# Patient Record
Sex: Female | Born: 1985
Health system: Southern US, Community
[De-identification: ages and names within clinical notes are randomized; demographics above are authoritative.]

---

## 2019-05-06 ENCOUNTER — Telehealth: Payer: Self-pay

## 2019-05-06 NOTE — Telephone Encounter (Signed)
Ok to schedule with me. Thanks

## 2019-05-06 NOTE — Telephone Encounter (Signed)
Patient contacted the office. Patient and her husband both primarily speak spanish, and they are both looking for a PCP. Patient states they were referred to Dr. Reece Agar by a friend, and she states they would feel more comfortable with a PCP who could speak with them in spanish. Dr. Reece Agar, would you take this patient and her husband as a new patient? If not, do you have any recommendations?

## 2019-05-10 NOTE — Telephone Encounter (Signed)
Patient and husband have been scheduled. Thanks !

## 2019-05-26 ENCOUNTER — Ambulatory Visit: Payer: BLUE CROSS/BLUE SHIELD | Admitting: Family Medicine

## 2019-05-26 ENCOUNTER — Encounter: Payer: Self-pay | Admitting: Family Medicine

## 2019-05-26 ENCOUNTER — Other Ambulatory Visit: Payer: Self-pay

## 2019-05-26 VITALS — BP 132/80 | HR 70 | Temp 97.9°F | Ht 63.0 in | Wt 173.3 lb

## 2019-05-26 DIAGNOSIS — L608 Other nail disorders: Secondary | ICD-10-CM | POA: Diagnosis not present

## 2019-05-26 DIAGNOSIS — Z131 Encounter for screening for diabetes mellitus: Secondary | ICD-10-CM | POA: Diagnosis not present

## 2019-05-26 DIAGNOSIS — Z13220 Encounter for screening for lipoid disorders: Secondary | ICD-10-CM | POA: Diagnosis not present

## 2019-05-26 DIAGNOSIS — L659 Nonscarring hair loss, unspecified: Secondary | ICD-10-CM

## 2019-05-26 NOTE — Patient Instructions (Addendum)
laboratorios hoy. Quiero que comienze vitamina sin receta biotin 5-10 mg diarios La llamaremos con [employer]. De pronto la remitiremos a Landscape architect.

## 2019-05-26 NOTE — Progress Notes (Signed)
This visit was conducted in person.  BP 132/80 (BP Location: Left Arm, Patient Position: Sitting, Cuff Size: Normal)   Pulse 70   Temp 97.9 F (36.6 C) (Temporal)   Ht 5\' 3"  (1.6 m)   Wt 173 lb 5 oz (78.6 kg)   SpO2 99%   BMI 30.70 kg/m    CC: new pt to establish  Subjective:    Patient ID: Rachel Buckley, female    DOB: 21-Dec-1985, 34 y.o.   MRN: [id number]  HPI: Rachel Buckley is a 34 y.o. female presenting on 05/26/2019 for New Patient (Initial Visit)   New pt to establish care.  Has not had PCP previously.   Noticing hair loss associated with irritated and itchy scalp. Ongoing for years. No bald spots. Notes more at top and at sides of head.  She has tried different OTC shampoos, home remedies.  Has tried hydrocortisone soln (scalpicin) intermittently for the past year - continues this.  Currently using head and shoulders shampoo. No conditioner.   New dark line to R thumb present for a few months.   Last meal 12:30pm.   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 Interim medical history since our last visit reviewed. Allergies and medications reviewed and updated. Outpatient Medications Prior to Visit  Medication Sig Dispense Refill  . Ascorbic Acid (VITAMIN C PO) Take by mouth daily.    Marland Kitchen CALCIUM PO Take by mouth daily.    . Cholecalciferol (VITAMIN D3 PO) Take by mouth. 3 times a week    . ECHINACEA PO Take by mouth. 3 times a week    . Glucosamine HCl (GLUCOSAMINE PO) Take by mouth. 3 times a week    . Multiple Vitamin (MULTIVITAMIN PO) Take by mouth daily.     No facility-administered medications prior to  visit.     Per HPI unless specifically indicated in ROS section below Review of Systems Objective:    BP 132/80 (BP Location: Left Arm, Patient Position: Sitting, Cuff Size: Normal)   Pulse 70   Temp 97.9 F (36.6 C) (Temporal)   Ht 5\' 3"  (1.6 m)   Wt 173 lb 5 oz (78.6 kg)   SpO2 99%   BMI 30.70 kg/m   Wt Readings from Last 3 Encounters:  05/26/19 173 lb 5 oz (78.6 kg)    Physical Exam Vitals and nursing note reviewed.  Constitutional:      Appearance: Normal appearance. She is not ill-appearing.  HENT:     Head: Normocephalic and atraumatic.  Eyes:     Extraocular Movements: Extraocular movements intact.     Pupils: Pupils are equal, round, and reactive to light.  Neck:     Thyroid: No thyromegaly or thyroid tenderness.  Cardiovascular:     Rate and Rhythm: Normal rate and regular rhythm.     Pulses: Normal pulses.     Heart sounds: Normal heart sounds. No murmur.  Pulmonary:     Effort: Pulmonary effort is normal. No respiratory distress.     Breath sounds: Normal breath sounds. No wheezing, rhonchi or rales.  Musculoskeletal:        General: Normal range of  motion.     Cervical back: Normal range of motion and neck supple.     Right lower leg: No edema.     Left lower leg: No edema.  Lymphadenopathy:     Cervical: No cervical adenopathy.  Skin:    General: Skin is warm and dry.     Findings: Erythema present. No rash.     Comments:  Slight erythema to temporal scalp Evident thinning on frontal scalp midline and on sides Hair pull test with 2 strands- one with root, one without R thumb nail with longitudinal hyperpigmented line present  Neurological:     Mental Status: She is alert.  Psychiatric:        Mood and Affect: Mood normal.        Behavior: Behavior normal.       Results for orders placed or performed in visit on 05/26/19  Ferritin  Result Value Ref Range   Ferritin 14 (L) 16 - 154 ng/mL  Basic metabolic panel  Result Value Ref Range    Glucose, Bld 94 65 - 99 mg/dL   BUN 9 7 - 25 mg/dL   Creat 5.39 7.67 - 3.41 mg/dL   BUN/Creatinine Ratio NOT APPLICABLE 6 - 22 (calc)   Sodium 140 135 - 146 mmol/L   Potassium 4.1 3.5 - 5.3 mmol/L   Chloride 102 98 - 110 mmol/L   CO2 27 20 - 32 mmol/L   Calcium 9.9 8.6 - 10.2 mg/dL  Lipid panel  Result Value Ref Range   Cholesterol 207 (H) <200 mg/dL   HDL 49 (L) > OR = 50 mg/dL   Triglycerides 937 <902 mg/dL   LDL Cholesterol (Calc) 133 (H) mg/dL (calc)   Total CHOL/HDL Ratio 4.2 <5.0 (calc)   Non-HDL Cholesterol (Calc) 158 (H) <130 mg/dL (calc)  TSH  Result Value Ref Range   TSH 3.69 mIU/L  CBC with Differential/Platelet  Result Value Ref Range   WBC 10.6 3.8 - 10.8 Thousand/uL   RBC 4.81 3.80 - 5.10 Million/uL   Hemoglobin 14.0 11.7 - 15.5 g/dL   HCT 40.9 73.5 - 32.9 %   MCV 86.1 80.0 - 100.0 fL   MCH 29.1 27.0 - 33.0 pg   MCHC 33.8 32.0 - 36.0 g/dL   RDW 92.4 26.8 - 34.1 %   Platelets 317 140 - 400 Thousand/uL   MPV 10.1 7.5 - 12.5 fL   Neutro Abs 5,692 1,500 - 7,800 cells/uL   Lymphs Abs 4,261 (H) 850 - 3,900 cells/uL   Absolute Monocytes 488 200 - 950 cells/uL   Eosinophils Absolute 127 15 - 500 cells/uL   Basophils Absolute 32 0 - 200 cells/uL   Neutrophils Relative % 53.7 %   Total Lymphocyte 40.2 %   Monocytes Relative 4.6 %   Eosinophils Relative 1.2 %   Basophils Relative 0.3 %  Iron,Total/Total Iron Binding Cap  Result Value Ref Range   Iron 39 (L) 40 - 190 mcg/dL   TIBC 962 229 - 798 mcg/dL (calc)   %SAT 10 (L) 16 - 45 % (calc)   Assessment & Plan:  This visit occurred during the SARS-CoV-2 public health emergency.  Safety protocols were in place, including screening questions prior to the visit, additional usage of staff PPE, and extensive cleaning of exam room while observing appropriate contact time as indicated for disinfecting solutions.   Problem List Items Addressed This Visit    Melanonychia striata    Noted longitudinal dark line to R thumb.  Will recommend derm eval.       Relevant Orders   Ambulatory referral to Dermatology   Hair loss - Primary    Chronic issue - will start eval with labwork and if unrevealing recommend dermatology evaluation. In interim, rec start biotin 5-10mg  daily. Reviewed OTC hydrocortisone soln (Scalpicin) use. Pt agrees with plan.       Relevant Orders   Ferritin (Completed)   Basic metabolic panel (Completed)   Lipid panel (Completed)   TSH (Completed)   CBC with Differential/Platelet (Completed)   Iron and TIBC   Ambulatory referral to Dermatology    Other Visit Diagnoses    Lipid screening       Relevant Orders   Ferritin (Completed)   Basic metabolic panel (Completed)   Lipid panel (Completed)   TSH (Completed)   CBC with Differential/Platelet (Completed)   Iron and TIBC   Diabetes mellitus screening       Relevant Orders   Ferritin (Completed)   Basic metabolic panel (Completed)   Lipid panel (Completed)   TSH (Completed)   CBC with Differential/Platelet (Completed)   Iron and TIBC       Meds ordered this encounter  Medications  . ferrous sulfate 325 (65 FE) MG EC tablet    Sig: Take 1 tablet (325 mg total) by mouth daily with breakfast.  . Biotin 5 MG CAPS    Sig: Take 1 capsule (5 mg total) by mouth daily.    Refill:  0   Orders Placed This Encounter  Procedures  . Ferritin  . Basic metabolic panel  . Lipid panel  . TSH  . CBC with Differential/Platelet  . Iron and TIBC  . Iron,Total/Total Iron Binding Cap  . Ambulatory referral to Dermatology    Referral Priority:   Routine    Referral Type:   Consultation    Referral Reason:   Specialty Services Required    Requested Specialty:   Dermatology    Number of Visits Requested:   1    Patient Instructions  laboratorios hoy. Quiero que comienze vitamina sin receta biotin 5-10 mg diarios La llamaremos con [employer]. De pronto la remitiremos a Landscape architect.    Follow up plan: Return in about 1 year  (around 05/25/2020) for annual exam, prior fasting for blood work.  Eustaquio Boyden, MD

## 2019-05-27 DIAGNOSIS — L659 Nonscarring hair loss, unspecified: Secondary | ICD-10-CM | POA: Insufficient documentation

## 2019-05-27 DIAGNOSIS — L608 Other nail disorders: Secondary | ICD-10-CM | POA: Insufficient documentation

## 2019-05-27 LAB — CBC WITH DIFFERENTIAL/PLATELET
Absolute Monocytes: 488 cells/uL (ref 200–950)
Basophils Absolute: 32 cells/uL (ref 0–200)
Basophils Relative: 0.3 %
Eosinophils Absolute: 127 cells/uL (ref 15–500)
Eosinophils Relative: 1.2 %
HCT: 41.4 % (ref 35.0–45.0)
Hemoglobin: 14 g/dL (ref 11.7–15.5)
Lymphs Abs: 4261 cells/uL — ABNORMAL HIGH (ref 850–3900)
MCH: 29.1 pg (ref 27.0–33.0)
MCHC: 33.8 g/dL (ref 32.0–36.0)
MCV: 86.1 fL (ref 80.0–100.0)
MPV: 10.1 fL (ref 7.5–12.5)
Monocytes Relative: 4.6 %
Neutro Abs: 5692 cells/uL (ref 1500–7800)
Neutrophils Relative %: 53.7 %
Platelets: 317 10*3/uL (ref 140–400)
RBC: 4.81 10*6/uL (ref 3.80–5.10)
RDW: 12.4 % (ref 11.0–15.0)
Total Lymphocyte: 40.2 %
WBC: 10.6 10*3/uL (ref 3.8–10.8)

## 2019-05-27 LAB — LIPID PANEL
Cholesterol: 207 mg/dL — ABNORMAL HIGH (ref <200)
HDL: 49 mg/dL — ABNORMAL LOW (ref >=50)
LDL Cholesterol (Calc): 133 mg/dL (calc) — ABNORMAL HIGH
Non-HDL Cholesterol (Calc): 158 mg/dL (calc) — ABNORMAL HIGH (ref <130)
Total CHOL/HDL Ratio: 4.2 (calc) (ref <5.0)
Triglycerides: 134 mg/dL (ref <150)

## 2019-05-27 LAB — BASIC METABOLIC PANEL
BUN: 9 mg/dL (ref 7–25)
CO2: 27 mmol/L (ref 20–32)
Calcium: 9.9 mg/dL (ref 8.6–10.2)
Chloride: 102 mmol/L (ref 98–110)
Creat: 0.72 mg/dL (ref 0.50–1.10)
Glucose, Bld: 94 mg/dL (ref 65–99)
Potassium: 4.1 mmol/L (ref 3.5–5.3)
Sodium: 140 mmol/L (ref 135–146)

## 2019-05-27 LAB — IRON, TOTAL/TOTAL IRON BINDING CAP
%SAT: 10 % (calc) — ABNORMAL LOW (ref 16–45)
Iron: 39 ug/dL — ABNORMAL LOW (ref 40–190)
TIBC: 408 mcg/dL (calc) (ref 250–450)

## 2019-05-27 LAB — FERRITIN: Ferritin: 14 ng/mL — ABNORMAL LOW (ref 16–154)

## 2019-05-27 LAB — TSH: TSH: 3.69 mIU/L

## 2019-05-27 MED ORDER — BIOTIN 5 MG PO CAPS: 1.0000 | ORAL_CAPSULE | Freq: Every day | ORAL | 0 refills | Status: AC

## 2019-05-27 MED ORDER — FERROUS SULFATE 325 (65 FE) MG PO TBEC: 325.0000 mg | DELAYED_RELEASE_TABLET | Freq: Every day | ORAL | Status: AC

## 2019-05-27 NOTE — Assessment & Plan Note (Addendum)
Noted longitudinal dark line to R thumb.  Will recommend derm eval.

## 2019-05-27 NOTE — Assessment & Plan Note (Signed)
Chronic issue - will start eval with labwork and if unrevealing recommend dermatology evaluation. In interim, rec start biotin 5-10mg  daily. Reviewed OTC hydrocortisone soln (Scalpicin) use. Pt agrees with plan.

## 2019-07-05 ENCOUNTER — Ambulatory Visit: Payer: BLUE CROSS/BLUE SHIELD | Admitting: Dermatology

## 2019-07-05 ENCOUNTER — Other Ambulatory Visit: Payer: Self-pay

## 2019-07-05 DIAGNOSIS — L608 Other nail disorders: Secondary | ICD-10-CM

## 2019-07-05 DIAGNOSIS — L659 Nonscarring hair loss, unspecified: Secondary | ICD-10-CM

## 2019-07-05 DIAGNOSIS — L219 Seborrheic dermatitis, unspecified: Secondary | ICD-10-CM

## 2019-07-05 MED ORDER — KETOCONAZOLE 2 % EX SHAM: MEDICATED_SHAMPOO | CUTANEOUS | 2 refills | Status: DC

## 2019-07-05 MED ORDER — MOMETASONE FUROATE 0.1 % EX SOLN: CUTANEOUS | 2 refills | Status: DC

## 2019-07-05 NOTE — Progress Notes (Signed)
   New Patient Visit  Subjective  Rachel Buckley is a 34 y.o. female who presents for the following: Itching, bumps (scalp - Using Head & Shoulders, Scalpicin - helps itching.); Hairloss (for years, mainly in frontal scalp. Had labs done at PCP, low iron. Pt started iron supplement 10mg  tid. No recent surgery or illness. No new meds. No family history of hairloss.); and Hyperpigmented line (R thumb nail x 3-4 mos.).   Objective  Well appearing patient in no apparent distress; mood and affect are within normal limits.  A focused examination was performed including scalp, right thumb nail. Relevant physical exam findings are noted in the Assessment and Plan.  Objective  scalp: Diffuse thinning of hair. Recent labs from PCP showed very low Ferritin levels (14), Fe %Sat is also low.  TSH and Hb/Hct normal.  Objective  Right Thumb Nail: Vertical light brown pigmentation, 1.0 mm width. No irregularities. No pigment at cuticle.  Images    Objective  Scalp: Mild diffuse greasy scaling.  Assessment & Plan  Alopecia scalp  Telogen hair loss due to low ferritin.  Need to get ferritin levels up to 50 ng/ml for good hair growth.  Will repeat ferritin lab on f/up Start Slow Fe 45mg  take 1 pill twice daily with Vit C  Longitudinal melanonychia Right Thumb Nail  Benign-appearing, observe.  Discussed this is a benign lentigo (freckle) of the nail.   Seborrheic dermatitis Scalp  ketoconazole (NIZORAL) 2 % shampoo - Scalp  mometasone (ELOCON) 0.1 % lotion - Scalp  Return in about 2 months (around 09/04/2019) for alopecia, seb derm, nail.   I , CMA, am acting as scribe for 09/06/2019, MD .

## 2019-09-14 ENCOUNTER — Ambulatory Visit: Payer: BLUE CROSS/BLUE SHIELD | Admitting: Dermatology

## 2019-09-14 ENCOUNTER — Other Ambulatory Visit: Payer: Self-pay

## 2019-09-14 DIAGNOSIS — L219 Seborrheic dermatitis, unspecified: Secondary | ICD-10-CM

## 2019-09-14 DIAGNOSIS — L659 Nonscarring hair loss, unspecified: Secondary | ICD-10-CM | POA: Diagnosis not present

## 2019-09-14 DIAGNOSIS — R21 Rash and other nonspecific skin eruption: Secondary | ICD-10-CM

## 2019-09-14 DIAGNOSIS — L608 Other nail disorders: Secondary | ICD-10-CM

## 2019-09-14 MED ORDER — MOMETASONE FUROATE 0.1 % EX SOLN: CUTANEOUS | 2 refills | Status: AC

## 2019-09-14 MED ORDER — KETOCONAZOLE 2 % EX SHAM: MEDICATED_SHAMPOO | CUTANEOUS | 2 refills | Status: AC

## 2019-09-14 MED ORDER — MOMETASONE FUROATE 0.1 % EX CREA: TOPICAL_CREAM | CUTANEOUS | 1 refills | Status: AC

## 2019-09-14 NOTE — Progress Notes (Signed)
Follow-Up Visit   Subjective  Rachel Buckley is a 34 y.o. female who presents for the following: scalp no longer itches but still losing hair. Seb Derm (scalp. Improved with ketoconazole 2% shampoo and mometasone lotion.), Alopicia (Telogen effluvium due to low ferritin. Pt states she is still having hairloss. She takes SlowFe 45mg  BID with Vitamin C.), Bumps (Arms. Worse in summer; itchy.), and Longitudinal Melanonychia (Right thumb nail. No change since last visit, see photo.).  She also has an itchy bumpy rash on her forearms.  It comes in early summer when exposed to sun, and gets better over time.   The following portions of the chart were reviewed this encounter and updated as appropriate:      Review of Systems:  No other skin or systemic complaints except as noted in HPI or Assessment and Plan.  Objective  Well appearing patient in no apparent distress; mood and affect are within normal limits.  A focused examination was performed including face, arms, scalp. Relevant physical exam findings are noted in the Assessment and Plan.  Objective  Scalp: Diffuse hair thinning of scalp. Telogen hairs with hair pull today.  Prior labs reviewed- ferritin level was 14  Images        Objective  Right Thumb Nail: Vertical light brown pigmentation. No change when compared to photo  Objective  bil forearms: Mild erythema with tiny pink papules and xerosis  Objective  Scalp: Clear today.   Assessment & Plan  Alopecia Scalp  Telogen effluvium, due to low iron levels. No improvement.  Will repeat Ferritin/Iron/CBC.  Need to get to minimum 50 Ferritin level to see improvement, Continue Slow Fe 45 mg PO bid with Vit C  Start Rogaine 5% for women - Apply to scalp at night.  Ferritin - Scalp  CBC (no diff) - Scalp  Iron and TIBC - Scalp  Longitudinal melanonychia Right Thumb Nail  Stable. Observe for changes.  ABCDEs discussed. Advised patient this is a benign  lentigo (freckle) of the nail.     Rash bil forearms  Probable PMLE  Start mometasone cream Apply to AAs rash on arms qd/bid until improved dsp 45g 1Rf.  Topical steroids (such as triamcinolone, fluocinolone, fluocinonide, mometasone, clobetasol, halobetasol, betamethasone, hydrocortisone) can cause thinning and lightening of the skin if they are used for too long in the same area. Your physician has selected the right strength medicine for your problem and area affected on the body. Please use your medication only as directed by your physician to prevent side effects.   Recommend daily broad spectrum sunscreen SPF 30+ to sun-exposed areas, reapply every 2 hours as needed. Discussed preferable to use sunscreen with Zinc for better UVA coverage.    mometasone (ELOCON) 0.1 % cream - bil forearms  Seborrheic dermatitis Scalp  Improved Continue ketoconazole 2% shampoo 2-3x/wk. Let sit several minutes before rinsing. Continue mometasone lotion qd/bid prn itch.   Topical steroids (such as triamcinolone, fluocinolone, fluocinonide, mometasone, clobetasol, halobetasol, betamethasone, hydrocortisone) can cause thinning and lightening of the skin if they are used for too long in the same area. Your physician has selected the right strength medicine for your problem and area affected on the body. Please use your medication only as directed by your physician to prevent side effects.    Reordered Medications ketoconazole (NIZORAL) 2 % shampoo mometasone (ELOCON) 0.1 % lotion  Return in about 3 months (around 12/15/2019) for alopecia.   IJamesetta Orleans, CMA, am acting as Education administrator for [employer]  Roseanne Reno, MD .  Documentation: I have reviewed the above documentation for accuracy and completeness, and I agree with the above.  Willeen Niece MD

## 2019-09-14 NOTE — Patient Instructions (Addendum)
Rogaine 5% for women - Apply to affected areas of scalp every night.   Recommend daily broad spectrum sunscreen SPF 30+ to sun-exposed areas, reapply every 2 hours as needed.

## 2019-09-15 ENCOUNTER — Telehealth: Payer: Self-pay

## 2019-09-15 LAB — CBC
Hematocrit: 42.4 % (ref 34.0–46.6)
Hemoglobin: 14.3 g/dL (ref 11.1–15.9)
MCH: 29.2 pg (ref 26.6–33.0)
MCHC: 33.7 g/dL (ref 31.5–35.7)
MCV: 87 fL (ref 79–97)
Platelets: 266 10*3/uL (ref 150–450)
RBC: 4.89 x10E6/uL (ref 3.77–5.28)
RDW: 13.3 % (ref 11.7–15.4)
WBC: 8.9 10*3/uL (ref 3.4–10.8)

## 2019-09-15 LAB — FERRITIN: Ferritin: 49 ng/mL (ref 15–150)

## 2019-09-15 LAB — IRON AND TIBC
Iron Saturation: 14 % — ABNORMAL LOW (ref 15–55)
Iron: 48 ug/dL (ref 27–159)
Total Iron Binding Capacity: 338 ug/dL (ref 250–450)
UIBC: 290 ug/dL (ref 131–425)

## 2019-09-15 NOTE — Telephone Encounter (Signed)
-----   Message from Willeen Niece, MD sent at 09/15/2019 10:32 AM EDT ----- Ferritin is at 49.  Still at low end of what we like for hair regrowth, but is improved from 14.  Hg is normal- no anemia.  Iron levels now within normal limits at 48 up from 39- again at lower end of reference range. I would recommend that she continue the iron supplements (Slow Fe 45 mg PO bid with Vit C).  Also recommend she start Rogaine 5% nightly as discussed.

## 2019-09-15 NOTE — Telephone Encounter (Signed)
Advised pt of lab results.  Advised pt to continue iron supplements and to start Rogaine 5% qhs.  Pt advised to keep 12/28/19 f/u appt./sh

## 2019-12-28 ENCOUNTER — Ambulatory Visit: Payer: BLUE CROSS/BLUE SHIELD | Admitting: Dermatology

## 2020-05-15 ENCOUNTER — Ambulatory Visit: Payer: BLUE CROSS/BLUE SHIELD | Admitting: Family Medicine

## 2020-05-15 ENCOUNTER — Other Ambulatory Visit: Payer: Self-pay

## 2020-05-15 ENCOUNTER — Ambulatory Visit (INDEPENDENT_AMBULATORY_CARE_PROVIDER_SITE_OTHER)
Admission: RE | Admit: 2020-05-15 | Discharge: 2020-05-15 | Disposition: A | Discharge status: Home / Self Care | Payer: BLUE CROSS/BLUE SHIELD | Source: Ambulatory Visit | Attending: Family Medicine | Admitting: Family Medicine

## 2020-05-15 ENCOUNTER — Encounter: Payer: Self-pay | Admitting: Family Medicine

## 2020-05-15 VITALS — BP 110/70 | HR 79 | Temp 98.0°F | Ht 63.0 in | Wt 184.0 lb

## 2020-05-15 DIAGNOSIS — M25572 Pain in left ankle and joints of left foot: Secondary | ICD-10-CM | POA: Diagnosis not present

## 2020-05-15 DIAGNOSIS — M67432 Ganglion, left wrist: Secondary | ICD-10-CM

## 2020-05-15 DIAGNOSIS — S93402A Sprain of unspecified ligament of left ankle, initial encounter: Secondary | ICD-10-CM | POA: Diagnosis not present

## 2020-05-15 DIAGNOSIS — M79672 Pain in left foot: Secondary | ICD-10-CM

## 2020-05-15 DIAGNOSIS — M7989 Other specified soft tissue disorders: Secondary | ICD-10-CM | POA: Diagnosis not present

## 2020-05-15 DIAGNOSIS — S99922A Unspecified injury of left foot, initial encounter: Secondary | ICD-10-CM | POA: Diagnosis not present

## 2020-05-15 NOTE — Progress Notes (Signed)
Rachel [name] T. Dalicia [name], MD, CAQ Sports Medicine  Primary Care and Sports Medicine [facility] [address] [place], [address]  Phone: [phone]  FAX: [phone]  Rachel Buckley - 35 y.o. female  MRN [id number]  Date of Birth: Apr 12, 1985  Date: 05/15/2020  PCP: Eustaquio Boyden, MD  Referral: Eustaquio Boyden, MD  Chief Complaint  Patient presents with  . Ankle Pain    Slipped on Ice 2 weeks ago  . Knot on Left Wrist    This visit occurred during the SARS-CoV-2 public health emergency.  Safety protocols were in place, including screening questions prior to the visit, additional usage of staff PPE, and extensive cleaning of exam room while observing appropriate contact time as indicated for disinfecting solutions.   Subjective:   Rachel Buckley is a 35 y.o. very pleasant female patient with Body mass index is 32.59 kg/m. who presents with the following:  She presents with an acute ankle pain / sprain? 2 weeks ago she fell on the ice.  At that point she developed some severe ankle pain.  Since then, she has had quite a bit of difficulty ambulating on her left foot and ankle.  She does have a severe limp here in the office today.  She also reports a more distant injury a year or so ago, and that point she did not seek medical attention, but she had significant pain and swelling and bruising throughout her foot and ankle.  This was the first time she has sought medical care.  She is not on crutches, and she is not on any other assistive device.  She also has a left ganglion cyst that she was curious about on the dorsum of her wrist.  Works at [employer].  Review of Systems is noted in the HPI, as appropriate   Objective:   BP 110/70   Pulse 79   Temp 98 F (36.7 C) (Temporal)   Ht 5\' 3"  (1.6 m)   Wt 184 lb (83.5 kg)   LMP 05/08/2020   SpO2 98%   BMI 32.59 kg/m   Obvious ganglion cyst dorsum of left wrist.  Left foot and ankle  is notably swollen.  Minimal bruising.  Kleiger testing is negative, and no significant tenderness with compressing the tibia and fibula throughout the entirety of the lower extremity.  No significant tenderness in the medial lateral malleolus.  Calcaneus is nontender.  Nontender at the fifth of the metatarsal.  Nontender at the cuboid, navicular.  She does have tenderness with palpation of the talus.  Minimally tender along all metatarsals.  With anterior drawer testing there is significant shift without cause of significant pain.  She does have some tenderness at the CFL and more anteriorly.  Radiology: DG Ankle Complete Left  Result Date: 05/15/2020 CLINICAL DATA:  Acute pain due to trauma. EXAM: LEFT ANKLE COMPLETE - 3+ VIEW; LEFT FOOT - COMPLETE 3+ VIEW COMPARISON:  None. FINDINGS: There is soft tissue swelling about the ankle and foot. There is no definite acute displaced fracture. No dislocation. There are no significant degenerative changes. There is a small plantar calcaneal spur. IMPRESSION: Soft tissue swelling without definite acute displaced fracture or dislocation. Electronically Signed   By: Katherine Mantle M.D.   On: 05/15/2020 22:15   DG Foot Complete Left  Result Date: 05/15/2020 CLINICAL DATA:  Acute pain due to trauma. EXAM: LEFT ANKLE COMPLETE - 3+ VIEW; LEFT FOOT - COMPLETE 3+ VIEW COMPARISON:  None. FINDINGS:  There is soft tissue swelling about the ankle and foot. There is no definite acute displaced fracture. No dislocation. There are no significant degenerative changes. There is a small plantar calcaneal spur. IMPRESSION: Soft tissue swelling without definite acute displaced fracture or dislocation. Electronically Signed   By: Katherine Mantle M.D.   On: 05/15/2020 22:15     Assessment and Plan:     ICD-10-CM   1. Severe ankle sprain, left, initial encounter  S93.402A   2. Ganglion cyst of dorsum of left wrist  M67.432   3. Acute left ankle pain  M25.572 DG Ankle  Complete Left  4. Acute foot pain, left  M79.672 DG Foot Complete Left   Reassurance regarding ganglion cyst.  I would do nothing.  It is small.  Acute foot and ankle pain, suggestive of significant sprain without obvious acute fracture.  On her plain films there is calcification about the distal medial and lateral malleoli suggestive of prior possible fracture at some time in the past.  On exam, she has no endpoint at her anterior drawer, suggestive of complete ATFL tear at some point in her life.  I am going to place the patient in a cam walker boot and recheck her in 3 to 4 weeks.  Work note.  Social: Unable to exercise, unable to work without no restrictions, currently on light duty.  Orders Placed This Encounter  Procedures  . DG Ankle Complete Left  . DG Foot Complete Left    Follow-up: Return in about 3 weeks (around 06/05/2020).  Signed,  Elpidio Galea. Justus Droke, MD   Outpatient Encounter Medications as of 05/15/2020  Medication Sig  . Ascorbic Acid (VITAMIN C PO) Take by mouth daily.  . Biotin 5 MG CAPS Take 1 capsule (5 mg total) by mouth daily.  Marland Kitchen CALCIUM PO Take by mouth daily.  . Cholecalciferol (VITAMIN D3 PO) Take by mouth. 3 times a week  . ECHINACEA PO Take by mouth. 3 times a week  . ferrous sulfate 325 (65 FE) MG EC tablet Take 1 tablet (325 mg total) by mouth daily with breakfast.  . Glucosamine HCl (GLUCOSAMINE PO) Take by mouth. 3 times a week  . ketoconazole (NIZORAL) 2 % shampoo Apply to scalp 3x/wk, let sit several minutes before rinsing.  . mometasone (ELOCON) 0.1 % cream Apply 1-2 times a day to rash on arms until improved. Avoid face, groin, undearms.  . mometasone (ELOCON) 0.1 % lotion Apply to scalp 1-2 times daily for itch/bumps on scalp. Avoid face, groin, underarms.  . Multiple Vitamin (MULTIVITAMIN PO) Take by mouth daily.   No facility-administered encounter medications on file as of 05/15/2020.

## 2020-05-16 ENCOUNTER — Encounter: Payer: Self-pay | Admitting: Family Medicine

## 2020-06-05 ENCOUNTER — Ambulatory Visit: Payer: BLUE CROSS/BLUE SHIELD | Admitting: Family Medicine

## 2020-06-05 ENCOUNTER — Other Ambulatory Visit: Payer: Self-pay

## 2020-06-05 ENCOUNTER — Encounter: Payer: Self-pay | Admitting: Family Medicine

## 2020-06-05 VITALS — BP 100/72 | HR 78 | Temp 98.6°F | Ht 63.0 in | Wt 183.0 lb

## 2020-06-05 DIAGNOSIS — S93402A Sprain of unspecified ligament of left ankle, initial encounter: Secondary | ICD-10-CM

## 2020-06-05 NOTE — Progress Notes (Signed)
Rachel Pratte T. Tyjae Issa, MD, CAQ Sports Medicine  Primary Care and Sports Medicine [facility] [address] [place], [address]  Phone: [phone]  FAX: [phone]  Rachel Buckley - 35 y.o. female  MRN [id number]  Date of Birth: Jun 16, 1985  Date: 06/05/2020  PCP: Eustaquio Boyden, MD  Referral: Eustaquio Boyden, MD  Chief Complaint  Patient presents with  . Follow-up    Left Ankle Sprain    This visit occurred during the SARS-CoV-2 public health emergency.  Safety protocols were in place, including screening questions prior to the visit, additional usage of staff PPE, and extensive cleaning of exam room while observing appropriate contact time as indicated for disinfecting solutions.   Subjective:   Rachel Buckley is a 35 y.o. very pleasant female patient with Body mass index is 32.42 kg/m. who presents with the following:  F/u L ankle sprain s/p CAM walker x 3 weeks.  Last time I saw her, she had a fairly severe ankle sprain, and at that point I did place her into a cam walker boot.  She was really having some swelling in her foot and ankle and having a significant difficulty walking.  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  The swelling has significantly decreased and her global pain is virtually gone.  Review of Systems is noted in the HPI, as appropriate   Objective:   BP 100/72   Pulse 78   Temp 98.6 F (37 C) (Temporal)   Ht 5\' 3"  (1.6 m)   Wt 183 lb (83 kg)   LMP 06/02/2020   SpO2 98%   BMI 32.42 kg/m   Left ankle: She does have some minor loss of motion in all directions.  Nontender throughout the tibia and fibula.  The forefoot and midfoot are nontender as well as the hindfoot and the ankle throughout.  Specifically, she has no tenderness at the ATFL, CFL, or deltoid ligaments.  Nontender at the Achilles tendon and the  tibialis and peroneal tendons.  Strength is minimally decreased in the left compared to the right.  Radiology: DG Ankle Complete Left  Result Date: 05/15/2020 CLINICAL DATA:  Acute pain due to trauma. EXAM: LEFT ANKLE COMPLETE - 3+ VIEW; LEFT FOOT - COMPLETE 3+ VIEW COMPARISON:  None. FINDINGS: There is soft tissue swelling about the ankle and foot. There is no definite acute displaced fracture. No dislocation. There are no significant degenerative changes. There is a small plantar calcaneal spur. IMPRESSION: Soft tissue swelling without definite acute displaced fracture or dislocation. Electronically Signed   By: 07/13/2020 M.D.   On: 05/15/2020 22:15   DG Foot Complete Left  Result Date: 05/15/2020 CLINICAL DATA:  Acute pain due to trauma. EXAM: LEFT ANKLE COMPLETE - 3+ VIEW; LEFT FOOT - COMPLETE 3+ VIEW COMPARISON:  None. FINDINGS: There is soft tissue swelling about the ankle and foot. There is no definite acute displaced fracture. No dislocation. There are no significant degenerative changes. There is a small plantar calcaneal spur. IMPRESSION: Soft tissue swelling without definite acute displaced fracture or dislocation. Electronically Signed   By: 07/13/2020 M.D.   On: 05/15/2020 22:15    Assessment and Plan:     ICD-10-CM   1. Severe ankle sprain, left, initial encounter  S93.402A    Resolve ankle sprain.  Titrate out of her boot over the next 2  to 3 days.  I gave her some home rehab to work on.  I think that the range of motion is primary issue here.  Also did extend her work limitation to sit/stand as needed while she is recovering and out of her cam boot for the next 2 weeks.  Signed,  Elpidio Galea. Jeno Calleros, MD   Outpatient Encounter Medications as of 06/05/2020  Medication Sig  . Ascorbic Acid (VITAMIN C PO) Take by mouth daily.  . Biotin 5 MG CAPS Take 1 capsule (5 mg total) by mouth daily.  Marland Kitchen CALCIUM PO Take by mouth daily.  . Cholecalciferol (VITAMIN D3 PO)  Take by mouth. 3 times a week  . ECHINACEA PO Take by mouth. 3 times a week  . ferrous sulfate 325 (65 FE) MG EC tablet Take 1 tablet (325 mg total) by mouth daily with breakfast.  . Glucosamine HCl (GLUCOSAMINE PO) Take by mouth. 3 times a week  . ketoconazole (NIZORAL) 2 % shampoo Apply to scalp 3x/wk, let sit several minutes before rinsing.  . mometasone (ELOCON) 0.1 % cream Apply 1-2 times a day to rash on arms until improved. Avoid face, groin, undearms.  . mometasone (ELOCON) 0.1 % lotion Apply to scalp 1-2 times daily for itch/bumps on scalp. Avoid face, groin, underarms.  . Multiple Vitamin (MULTIVITAMIN PO) Take by mouth daily.   No facility-administered encounter medications on file as of 06/05/2020.

## 2020-12-04 ENCOUNTER — Other Ambulatory Visit: Payer: Self-pay

## 2020-12-04 ENCOUNTER — Ambulatory Visit: Payer: BLUE CROSS/BLUE SHIELD | Admitting: Family Medicine

## 2020-12-04 VITALS — BP 106/70 | HR 77 | Temp 98.5°F | Wt 190.5 lb

## 2020-12-04 DIAGNOSIS — Z975 Presence of (intrauterine) contraceptive device: Secondary | ICD-10-CM | POA: Diagnosis not present

## 2020-12-04 DIAGNOSIS — N939 Abnormal uterine and vaginal bleeding, unspecified: Secondary | ICD-10-CM | POA: Diagnosis not present

## 2020-12-04 DIAGNOSIS — N921 Excessive and frequent menstruation with irregular cycle: Secondary | ICD-10-CM

## 2020-12-04 LAB — POCT URINE PREGNANCY: Preg Test, Ur: NEGATIVE

## 2020-12-04 MED ORDER — CELECOXIB 200 MG PO CAPS: 200.0000 mg | ORAL_CAPSULE | Freq: Every day | ORAL | 0 refills | Status: AC

## 2020-12-04 NOTE — Assessment & Plan Note (Signed)
Pregnancy test negative. Light bleeding. No symptoms of lightheadedness or fatigue to suggest anemia will defer blood work. Trial of celebrex for nexplanon related bleeding - take for 5-7 days. If symptoms resolve and return to normal can watch and wait. If no improvement or return of irregular bleeding given age will get [place] to rule out structural cause. Also advise that nexplanon due in 3 months so she will reach out to OB/GYN for additional discussion if no change in symptoms.

## 2020-12-04 NOTE — Progress Notes (Signed)
Subjective:     Rachel Buckley is a 35 y.o. female presenting for Menstrual Problem (Period that started July 22 has not stopped. It is a lighter flow but hasn't stopped completely )     HPI  #Heavy menses - got her regular cycle in July 22  - Day 1-2 were heavy, the lighter - typical period is 5-7 days - has had 2 week periods - since then has used about 1-2 pads per day for 3-4 weeks - is sexually active - female partners - Birth control - 03/13/2018 - this is her 3rd nexplanon - first time - had a regular cycle - did have some cramping with her nexplanon the second time  - periods have been lasting longer than 5 days and has been having cramps  Endorses some ovary pain on the right side during period   Review of Systems  Constitutional:  Negative for chills and fever.  Cardiovascular:  Negative for chest pain and palpitations.  Gastrointestinal:  Negative for abdominal distention, abdominal pain, nausea and vomiting.  Genitourinary:  Positive for menstrual problem.  Neurological:  Negative for dizziness and light-headedness.    Social History   Tobacco Use  Smoking Status Never  Smokeless Tobacco Never        Objective:    BP Readings from Last 3 Encounters:  12/04/20 106/70  06/05/20 100/72  05/15/20 110/70   Wt Readings from Last 3 Encounters:  12/04/20 190 lb 8 oz (86.4 kg)  06/05/20 183 lb (83 kg)  05/15/20 184 lb (83.5 kg)    BP 106/70   Pulse 77   Temp 98.5 F (36.9 C) (Temporal)   Wt 190 lb 8 oz (86.4 kg)   LMP 10/27/2020 (Exact Date)   SpO2 97%   BMI 33.75 kg/m    Physical Exam Constitutional:      General: She is not in acute distress.    Appearance: She is well-developed. She is not diaphoretic.  HENT:     Right Ear: External ear normal.     Left Ear: External ear normal.  Eyes:     Conjunctiva/sclera: Conjunctivae normal.  Cardiovascular:     Rate and Rhythm: Normal rate and regular rhythm.     Heart sounds: No murmur  heard. Pulmonary:     Effort: Pulmonary effort is normal. No respiratory distress.     Breath sounds: Normal breath sounds. No wheezing.  Abdominal:     General: Abdomen is flat. Bowel sounds are normal. There is no distension.     Palpations: Abdomen is soft.     Tenderness: There is no abdominal tenderness. There is no guarding or rebound.  Musculoskeletal:     Cervical back: Neck supple.  Skin:    General: Skin is warm and dry.     Capillary Refill: Capillary refill takes less than 2 seconds.  Neurological:     Mental Status: She is alert. Mental status is at baseline.  Psychiatric:        Mood and Affect: Mood normal.        Behavior: Behavior normal.    Urine pregnancy: negative      Assessment & Plan:   Problem List Items Addressed This Visit       Genitourinary   Abnormal uterine bleeding - Primary    Pregnancy test negative. Light bleeding. No symptoms of lightheadedness or fatigue to suggest anemia will defer blood work. Trial of celebrex for nexplanon related bleeding - take for 5-7 days. If  symptoms resolve and return to normal can watch and wait. If no improvement or return of irregular bleeding given age will get [place] to rule out structural cause. Also advise that nexplanon due in 3 months so she will reach out to OB/GYN for additional discussion if no change in symptoms.       Relevant Medications   celecoxib (CELEBREX) 200 MG capsule   Other Relevant Orders   POCT urine pregnancy (Completed)   US PELVIC COMPLETE WITH TRANSVAGINAL   Other Visit Diagnoses     Breakthrough bleeding on Nexplanon            Return if symptoms worsen or fail to improve.  Lynnda Child, MD  This visit occurred during the SARS-CoV-2 public health emergency.  Safety protocols were in place, including screening questions prior to the visit, additional usage of staff PPE, and extensive cleaning of exam room while observing appropriate contact time as indicated for disinfecting  solutions.

## 2020-12-04 NOTE — Patient Instructions (Addendum)
Abnormal uterine bleeding - This is likely related to your birth control - Take celebrex daily for 5-7 days to see if that will stop the bleeding - if you return to normal cycles, great - if you continue to bleed call your OB/GYN for a follow-up visit to discuss options - as you are close to replacement this could be reasonable.   If the medication works - you could repeat if the bleeding returns in the future

## 2020-12-18 ENCOUNTER — Ambulatory Visit: Payer: No Typology Code available for payment source

## 2021-02-01 ENCOUNTER — Ambulatory Visit (INDEPENDENT_AMBULATORY_CARE_PROVIDER_SITE_OTHER): Payer: No Typology Code available for payment source | Admitting: Family Medicine

## 2021-02-01 ENCOUNTER — Other Ambulatory Visit: Payer: Self-pay

## 2021-02-01 ENCOUNTER — Encounter: Payer: Self-pay | Admitting: Family Medicine

## 2021-02-01 VITALS — BP 102/62 | HR 63 | Temp 96.4°F | Wt 189.2 lb

## 2021-02-01 DIAGNOSIS — R300 Dysuria: Secondary | ICD-10-CM | POA: Diagnosis not present

## 2021-02-01 DIAGNOSIS — N3001 Acute cystitis with hematuria: Secondary | ICD-10-CM | POA: Diagnosis not present

## 2021-02-01 LAB — POC URINALSYSI DIPSTICK (AUTOMATED)
Bilirubin, UA: NEGATIVE
Blood, UA: POSITIVE
Glucose, UA: NEGATIVE
Ketones, UA: NEGATIVE
Nitrite, UA: NEGATIVE
Protein, UA: POSITIVE — AB
Spec Grav, UA: 1.01 (ref 1.010–1.025)
Urobilinogen, UA: 0.2 E.U./dL
pH, UA: 7 (ref 5.0–8.0)

## 2021-02-01 MED ORDER — NITROFURANTOIN MONOHYD MACRO 100 MG PO CAPS: 100.0000 mg | ORAL_CAPSULE | Freq: Two times a day (BID) | ORAL | 0 refills | Status: AC

## 2021-02-01 NOTE — Progress Notes (Signed)
   Subjective:     Frank Pilger is a 35 y.o. female presenting for Dysuria (X 1 week ) and Urinary Frequency     Dysuria  This is a new problem. The current episode started in the past 7 days. The problem occurs intermittently. The quality of the pain is described as burning. There has been no fever. Associated symptoms include frequency, hesitancy and urgency. Pertinent negatives include no flank pain, hematuria, nausea or vomiting. She has tried increased fluids for the symptoms.  Urinary Frequency  Associated symptoms include frequency, hesitancy and urgency. Pertinent negatives include no flank pain, hematuria, nausea or vomiting.      Review of Systems  Gastrointestinal:  Negative for nausea and vomiting.  Genitourinary:  Positive for dysuria, frequency, hesitancy and urgency. Negative for flank pain and hematuria.    Social History   Tobacco Use  Smoking Status Never  Smokeless Tobacco Never        Objective:    BP Readings from Last 3 Encounters:  02/01/21 102/62  12/04/20 106/70  06/05/20 100/72   Wt Readings from Last 3 Encounters:  02/01/21 189 lb 4 oz (85.8 kg)  12/04/20 190 lb 8 oz (86.4 kg)  06/05/20 183 lb (83 kg)    BP 102/62   Pulse 63   Temp (!) 96.4 F (35.8 C) (Temporal)   Wt 189 lb 4 oz (85.8 kg)   LMP 01/29/2021 (Exact Date)   SpO2 99%   BMI 33.52 kg/m    Physical Exam Constitutional:      General: She is not in acute distress.    Appearance: She is well-developed. She is not diaphoretic.  HENT:     Head: Normocephalic and atraumatic.  Eyes:     Conjunctiva/sclera: Conjunctivae normal.  Cardiovascular:     Rate and Rhythm: Normal rate and regular rhythm.     Heart sounds: Normal heart sounds.  Pulmonary:     Effort: Pulmonary effort is normal.  Abdominal:     General: Bowel sounds are normal. There is no distension.     Palpations: Abdomen is soft.     Tenderness: There is no abdominal tenderness. There is no guarding.   Musculoskeletal:     Cervical back: Neck supple.  Skin:    General: Skin is warm and dry.  Neurological:     Mental Status: She is alert.      UA: +LE, neg nitrites, + blood    Assessment & Plan:   Problem List Items Addressed This Visit   None Visit Diagnoses     Acute cystitis with hematuria    -  Primary   Relevant Medications   nitrofurantoin, macrocrystal-monohydrate, (MACROBID) 100 MG capsule   Other Relevant Orders   Urine Culture   Dysuria       Relevant Orders   POCT Urinalysis Dipstick (Automated) (Completed)      Urine and hx consistent with infection. Treat with macrobid, send for culture for sensitivities.   Blood likely 2/2 to current period.    Return if symptoms worsen or fail to improve.  Lynnda Child, MD  This visit occurred during the SARS-CoV-2 public health emergency.  Safety protocols were in place, including screening questions prior to the visit, additional usage of staff PPE, and extensive cleaning of exam room while observing appropriate contact time as indicated for disinfecting solutions.

## 2021-02-03 LAB — URINE CULTURE
MICRO NUMBER:: 12560203
SPECIMEN QUALITY:: ADEQUATE

## 2021-03-22 ENCOUNTER — Ambulatory Visit: Payer: No Typology Code available for payment source

## 2021-04-10 ENCOUNTER — Encounter: Payer: Self-pay | Admitting: Advanced Practice Midwife

## 2021-04-10 ENCOUNTER — Ambulatory Visit (LOCAL_COMMUNITY_HEALTH_CENTER): Payer: No Typology Code available for payment source | Admitting: Advanced Practice Midwife

## 2021-04-10 ENCOUNTER — Other Ambulatory Visit: Payer: Self-pay

## 2021-04-10 ENCOUNTER — Ambulatory Visit (LOCAL_COMMUNITY_HEALTH_CENTER): Payer: Self-pay | Admitting: Advanced Practice Midwife

## 2021-04-10 VITALS — BP 107/74 | Ht 63.5 in | Wt 187.2 lb

## 2021-04-10 DIAGNOSIS — Z3046 Encounter for surveillance of implantable subdermal contraceptive: Secondary | ICD-10-CM

## 2021-04-10 DIAGNOSIS — E669 Obesity, unspecified: Secondary | ICD-10-CM | POA: Insufficient documentation

## 2021-04-10 DIAGNOSIS — Z3009 Encounter for other general counseling and advice on contraception: Secondary | ICD-10-CM

## 2021-04-10 NOTE — Progress Notes (Signed)
[facility] DEPARTMENT [facility] [address] Main Number: [phone]    Family Planning Visit- Initial Visit  Subjective:  Rachel Buckley is a 36 y.o.  G3P1003 (70, 37 yo twins)  being seen today for an initial annual visit and to discuss reproductive life planning.  The patient is currently using Hormonal Implant for pregnancy prevention. Patient reports   does not want a pregnancy in the next year.  Patient has the following medical conditions has Hair loss; Melanonychia striata; Abnormal uterine bleeding; and Obesity BMI=32.6 on their problem list.  Chief Complaint  Patient presents with   Annual Exam   Contraception    Patient reports here for physical and Nexplanon removal/reinsertion. Nexplanon inserted 03/13/18. Last pap 03/13/18 neg HPV neg. LMP 03/31/21. Last sex last night without condom 04/09/21; 1 partner in last 3 mo. Last dental exam 2022. Last ETOH 04/07/21 (1 glass wine) 2x/year. Working 40 hrs/wk and living with her 3 girls.  Patient denies cigs, vaping, cigars, MJ  Body mass index is 32.64 kg/m. - Patient is eligible for diabetes screening based on BMI and age >45?  not applicable HA1C ordered? not applicable  Patient reports 1  partner/s in last year. Desires STI screening?  No - declines bloodwork  Has patient been screened once for HCV in the past?  No  No results found for: HCVAB  Does the patient have current drug use (including MJ), have a partner with drug use, and/or has been incarcerated since last result? No  If yes-- Screen for HCV through [facility]   Does the patient meet criteria for HBV testing? No  Criteria:  -Household, sexual or needle sharing contact with HBV -History of drug use -HIV positive -Those with known Hep C   Health Maintenance Due  Topic Date Due   COVID-19 Vaccine (1) Never done   HIV Screening  Never done   Hepatitis C Screening  Never done   TETANUS/TDAP  Never done   PAP  SMEAR-Modifier  Never done   INFLUENZA VACCINE  Never done    Review of Systems  All other systems reviewed and are negative.  The following portions of the patient's history were reviewed and updated as appropriate: allergies, current medications, past family history, past medical history, past social history, past surgical history and problem list. Problem list updated.   See flowsheet for other program required questions.  Objective:   Vitals:   04/10/21 1619  BP: 107/74  Weight: 187 lb 3.2 oz (84.9 kg)  Height: 5' 3.5" (1.613 m)    Physical Exam Constitutional:      Appearance: Normal appearance. Rachel Buckley is obese.  HENT:     Head: Normocephalic and atraumatic.     Mouth/Throat:     Mouth: Mucous membranes are moist.     Comments: Last dental exam 2022 Eyes:     Conjunctiva/sclera: Conjunctivae normal.  Neck:     Thyroid: No thyroid mass, thyromegaly or thyroid tenderness.  Cardiovascular:     Rate and Rhythm: Normal rate and regular rhythm.  Pulmonary:     Effort: Pulmonary effort is normal.     Breath sounds: Normal breath sounds.  Chest:  Breasts:    Right: Normal.     Left: Normal.  Abdominal:     Palpations: Abdomen is soft.     Comments: Soft without masses or tenderness, poor tone, increased adipose  Genitourinary:    General: Normal vulva.     Exam position:  Lithotomy position.     Vagina: Vaginal discharge (white creamy leukorrhea, ph<4.5) present.     Cervix: Normal.     Uterus: Normal.      Adnexa: Right adnexa normal and left adnexa normal.     Rectum: Normal.       Comments: Small firm round mass ~93mm nontender onset 3 days ago--counseled to take warm baths 1-2x/day x 5-7 days. If enlarges to call back Musculoskeletal:        General: Normal range of motion.     Cervical back: Normal range of motion and neck supple.  Skin:    General: Skin is warm and dry.  Neurological:     Mental Status: Rachel Buckley is alert.  Psychiatric:        Mood and Affect:  Mood normal.      Assessment and Plan:  Rashada Klontz is a 36 y.o. female presenting to the [facility] Department for an initial annual wellness/contraceptive visit  Contraception counseling: Reviewed all forms of birth control options in the tiered based approach. available including abstinence; over the counter/barrier methods; hormonal contraceptive medication including pill, patch, ring, injection,contraceptive implant, ECP; hormonal and nonhormonal IUDs; permanent sterilization options including vasectomy and the various tubal sterilization modalities. Risks, benefits, and typical effectiveness rates were reviewed.  Questions were answered.  Written information was also given to the patient to review.  Patient desires Hormonal Implant, this was prescribed for patient.    The patient will follow up in  1 week for surveillance.  The patient was told to call with any further questions, or with any concerns about this method of contraception.  Emphasized use of condoms 100% of the time for STI prevention.  Patient was not offered ECP based on not meeting criteria. ECP was not accepted by the patient. ECP counseling was not given - see RN documentation  1. Obesity, unspecified classification, unspecified obesity type, unspecified whether serious comorbidity present   2. Family planning See below - Chlamydia/Gonorrhea [facility]  3. Encounter for surveillance of implantable subdermal contraceptive Nexplanon inserted 03/13/18 Pt had unprotected sex last night and wants to come back next week with abstinance so can safely have Nexplanon removed and reinserted. Pt doesn't want a pregnancy and wants removal even though counseled Nexplanon is effective for 4 years     No follow-ups on file.  No future appointments.  Alberteen Spindle, CNM

## 2021-04-10 NOTE — Progress Notes (Signed)
Abstinence consent signed and patient scheduled to return for nexplanon removal and reinsertion.Burt Knack, RN

## 2021-04-10 NOTE — Progress Notes (Signed)
See other document from today.Burt Knack, RN

## 2021-04-10 NOTE — Progress Notes (Signed)
See other encounter from same day.

## 2021-04-19 ENCOUNTER — Other Ambulatory Visit: Payer: Self-pay

## 2021-04-19 ENCOUNTER — Ambulatory Visit (LOCAL_COMMUNITY_HEALTH_CENTER): Payer: Self-pay | Admitting: Advanced Practice Midwife

## 2021-04-19 VITALS — BP 112/74 | Ht 63.0 in | Wt 186.8 lb

## 2021-04-19 DIAGNOSIS — Z30017 Encounter for initial prescription of implantable subdermal contraceptive: Secondary | ICD-10-CM

## 2021-04-19 DIAGNOSIS — Z3009 Encounter for other general counseling and advice on contraception: Secondary | ICD-10-CM

## 2021-04-19 MED ORDER — ETONOGESTREL 68 MG ~~LOC~~ IMPL
68.0000 mg | DRUG_IMPLANT | Freq: Once | SUBCUTANEOUS | Status: AC
  Administered 2021-04-19: 68 mg via SUBCUTANEOUS

## 2021-04-19 NOTE — Progress Notes (Signed)
[facility] [address] Main Number: [phone]  Contraception/Family Planning VISIT ENCOUNTER NOTE  Subjective:   Rachel Buckley is a 36 y.o.seperated HF  G2P1003 (8, 36 yo twins) female here for reproductive life counseling. The patient is currently using Hormonal Implant to prevent pregnancy.  Desires removal/reinsertion of Nexplanon for BCM.  The patient does not want a pregnancy in the next year.  Pt was here for physical on 04/10/21 but last sex was 04/09/21 without condom so pt returns for Nexplanon removal/reinsertion today. Nexplanon inserted 03/13/18. LMP  03/31/21. Last pap 03/13/18 neg HPV neg. Pt states I have been her provider for 20 years   Denies abnormal vaginal bleeding, discharge, pelvic pain, problems with intercourse or other gynecologic concerns.    Gynecologic History Patient's last menstrual period was 03/31/2021 (exact date).  Health Maintenance Due  Topic Date Due   COVID-19 Vaccine (1) Never done   HIV Screening  Never done   Hepatitis C Screening  Never done   TETANUS/TDAP  Never done   PAP SMEAR-Modifier  Never done   INFLUENZA VACCINE  Never done     The following portions of the patient's history were reviewed and updated as appropriate: allergies, current medications, past family history, past medical history, past social history, past surgical history and problem list.  Review of Systems Pertinent items are noted in HPI.   Objective:  BP 112/74    Ht 5\' 3"  (1.6 m)    Wt 186 lb 12.8 oz (84.7 kg)    LMP 03/31/2021 (Exact Date)    BMI 33.09 kg/m  Gen: well appearing, NAD HEENT: no scleral icterus CV: RR Lung: Normal WOB Ext: warm well perfused     Assessment and Plan:   Contraception counseling: Reviewed all forms of birth control options in the tiered based approach. available including abstinence; over the counter/barrier methods; hormonal contraceptive medication including pill,  patch, ring, injection,contraceptive implant, ECP; hormonal and nonhormonal IUDs; permanent sterilization options including vasectomy and the various tubal sterilization modalities. Risks, benefits, and typical effectiveness rates were reviewed.  Questions were answered.  Written information was also given to the patient to review.  Patient desires Nexplanon removal/reinsertion, this was prescribed for patient. She will follow up in  1 year or prn for surveillance.  She was told to call with any further questions, or with any concerns about this method of contraception.  Emphasized use of condoms 100% of the time for STI prevention.  Patient was not offered ECP due to not meeting criteria. ECP not accepted by the patient. ECP counseling was not given - see RN documentation  1. Family planning Counseled pt to abstain/use back up condoms next 7 days  2. Encounter for initial prescription of implantable subdermal contraceptive Nexplanon Removal and Insertion  Patient identified, informed consent performed, consent signed.   Patient does understand that irregular bleeding is a very common side effect of this medication. She was advised to have backup contraception for one week after replacement of the implant. Patient deemed to meet WHO criteria for being reasonably certain she is not pregnant.  Appropriate time out taken. Nexplanon site identified. Area prepped in usual sterile fashon. 3 ml of 1% lidocaine with epinephrine was used to anesthetize the area at the distal end of the implant. A small stab incision was made right beside the implant on the distal portion. The Nexplanon rod was grasped using hemostats and removed without difficulty. There was minimal blood loss.  There were no complications.   Confirmed correct location of insertion site. The insertion site was identified 8-10 cm (3-4 inches) from the medial epicondyle of the humerus and 3-5 cm (1.25-2 inches) posterior to (below) the sulcus  (groove) between the biceps and triceps muscles of the patient's left arm. New Nexplanon removed from packaging, Device confirmed in needle, then inserted full length of needle and withdrawn per handbook instructions. Nexplanon was able to palpated in the patient's left arm; patient palpated the insert herself.  There was minimal blood loss. Patient insertion site covered with guaze and a pressure bandage to reduce any bruising. The patient tolerated the procedure well and was given post procedure instructions.    Nexplanon:   Counseled patient to take OTC analgesic starting as soon as lidocaine starts to wear off and take regularly for at least 48 hr to decrease discomfort.  Specifically to take with food or milk to decrease stomach upset and for IB 600 mg (3 tablets) every 6 hrs; IB 800 mg (4 tablets) every 8 hrs; or Aleve 2 tablets every 12 hrs.      Please refer to After Visit Summary for other counseling recommendations.   Return in about 1 year (around 04/19/2022) for yearly physical exam.  Alberteen Spindle, CNM [facility] DEPARTMENT

## 2021-04-19 NOTE — Progress Notes (Signed)
Pt here for Nexplanon removal/reinsertion.  Pt's last PE was in 05/2020.  Berdie Ogren, RN

## 2021-08-06 ENCOUNTER — Telehealth: Payer: Self-pay

## 2021-08-06 NOTE — Telephone Encounter (Signed)
Patient called wanted to know what she would need to pay to be seen. She no longer has insurance.  ?

## 2022-04-22 IMAGING — DX DG ANKLE COMPLETE 3+V*L*
3 series · 3 of 3 positions shown · non-contrast
Comparison: None.

CLINICAL DATA: Acute pain due to trauma.

EXAM:
LEFT ANKLE COMPLETE - 3+ VIEW; LEFT FOOT - COMPLETE 3+ VIEW

[ankle ap]
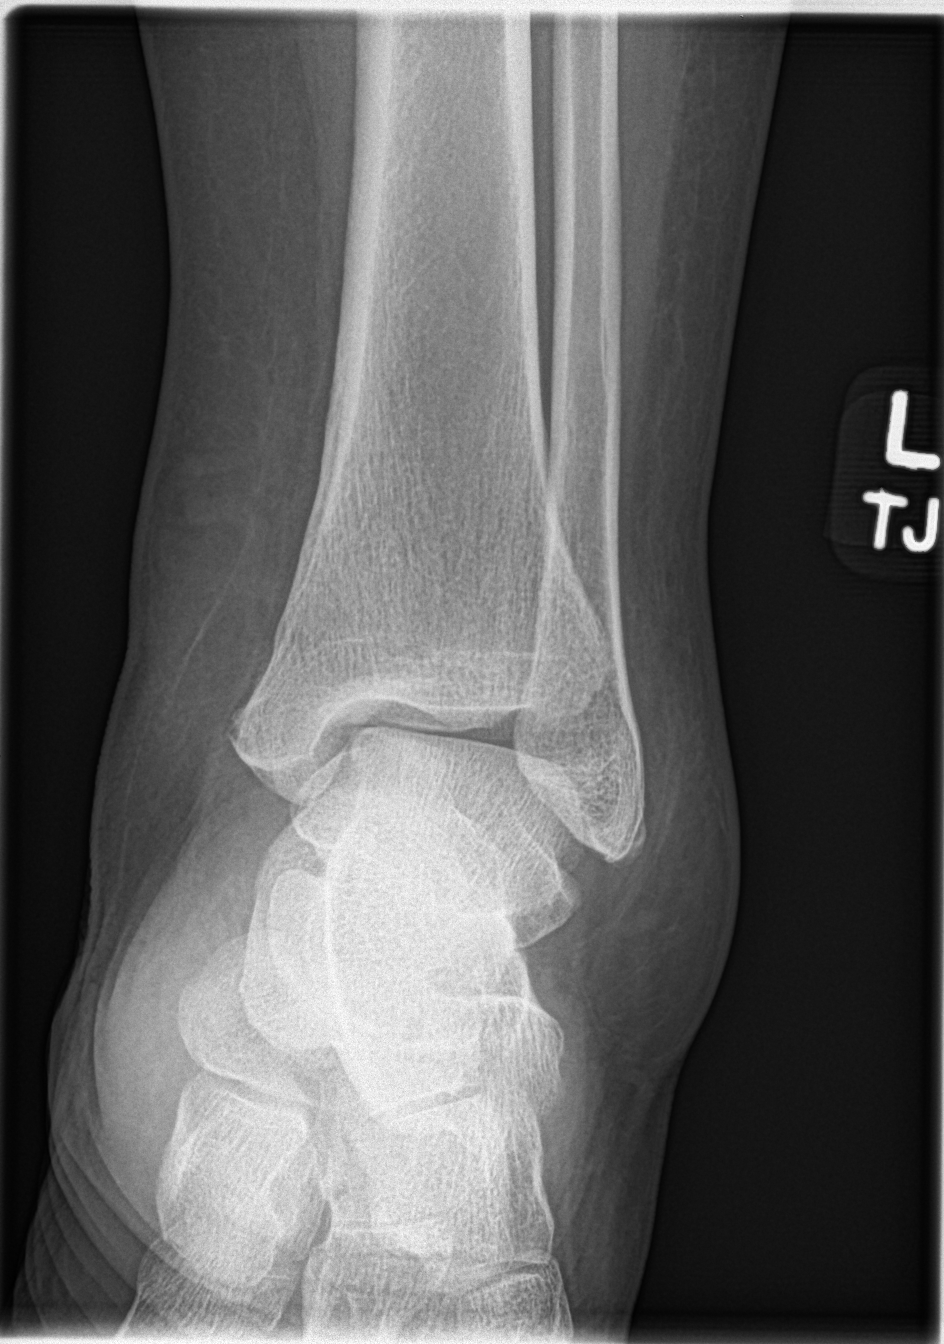

[ankle obl]
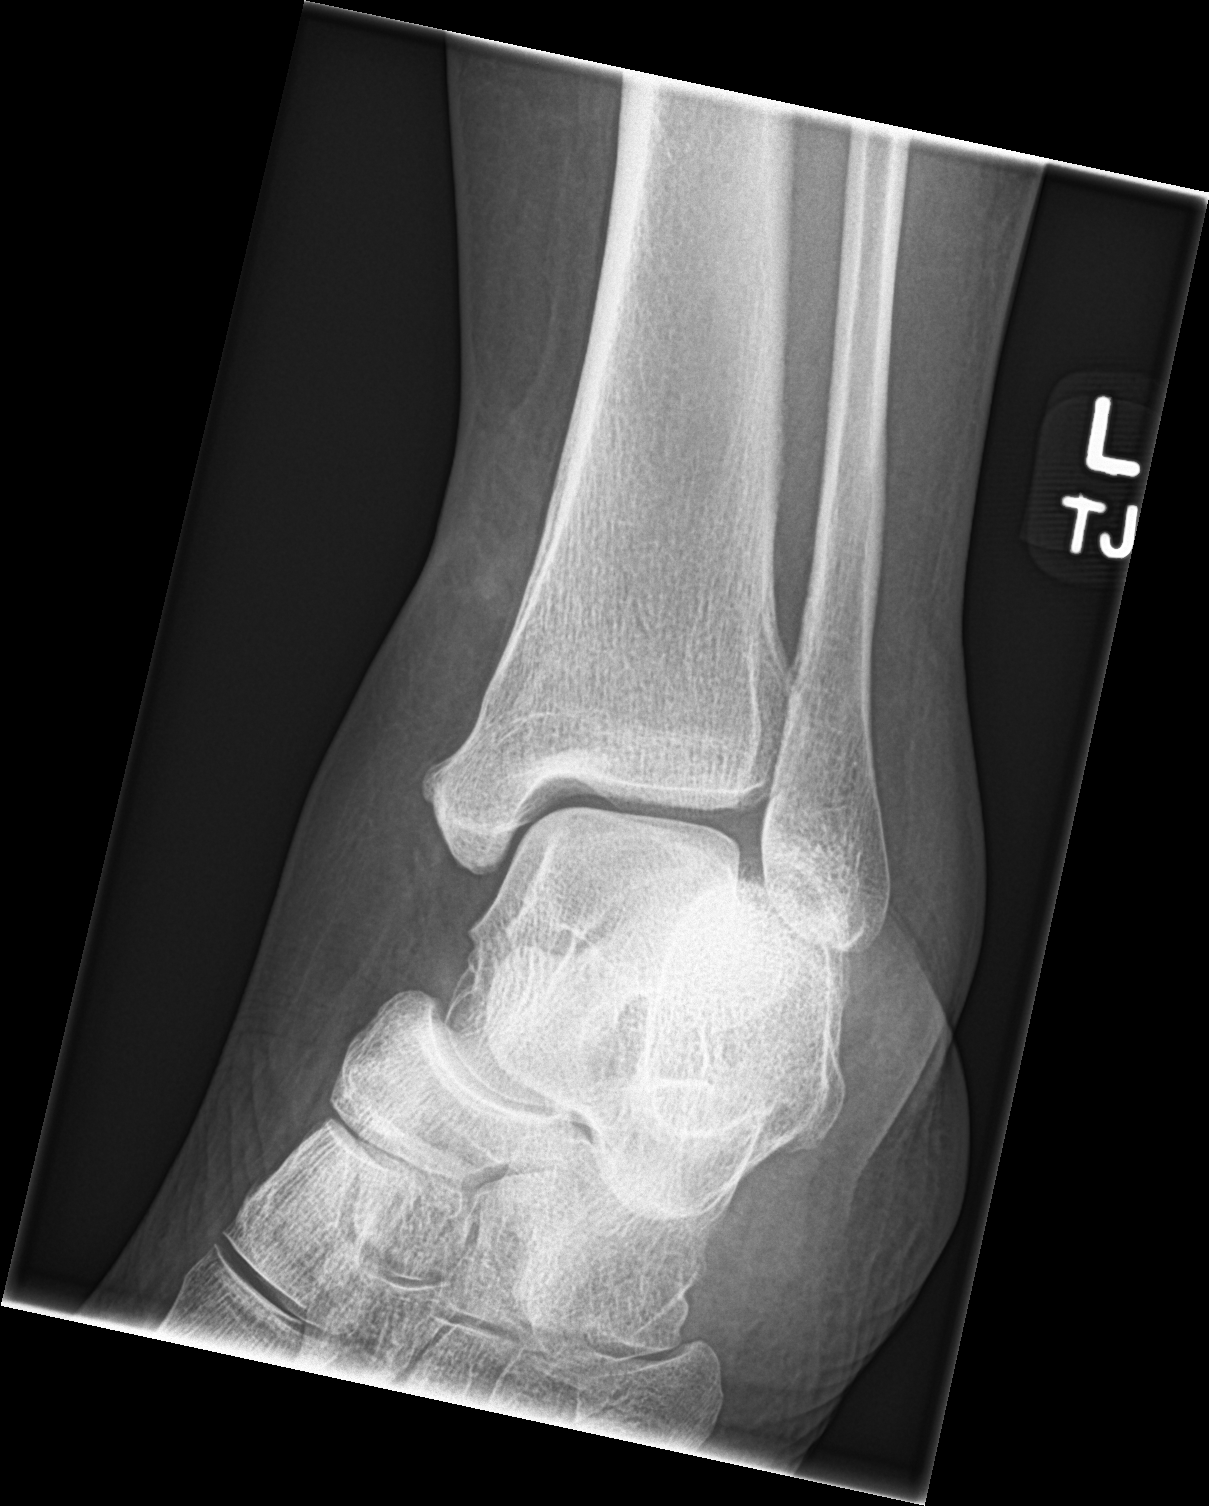

[ankle mortise]
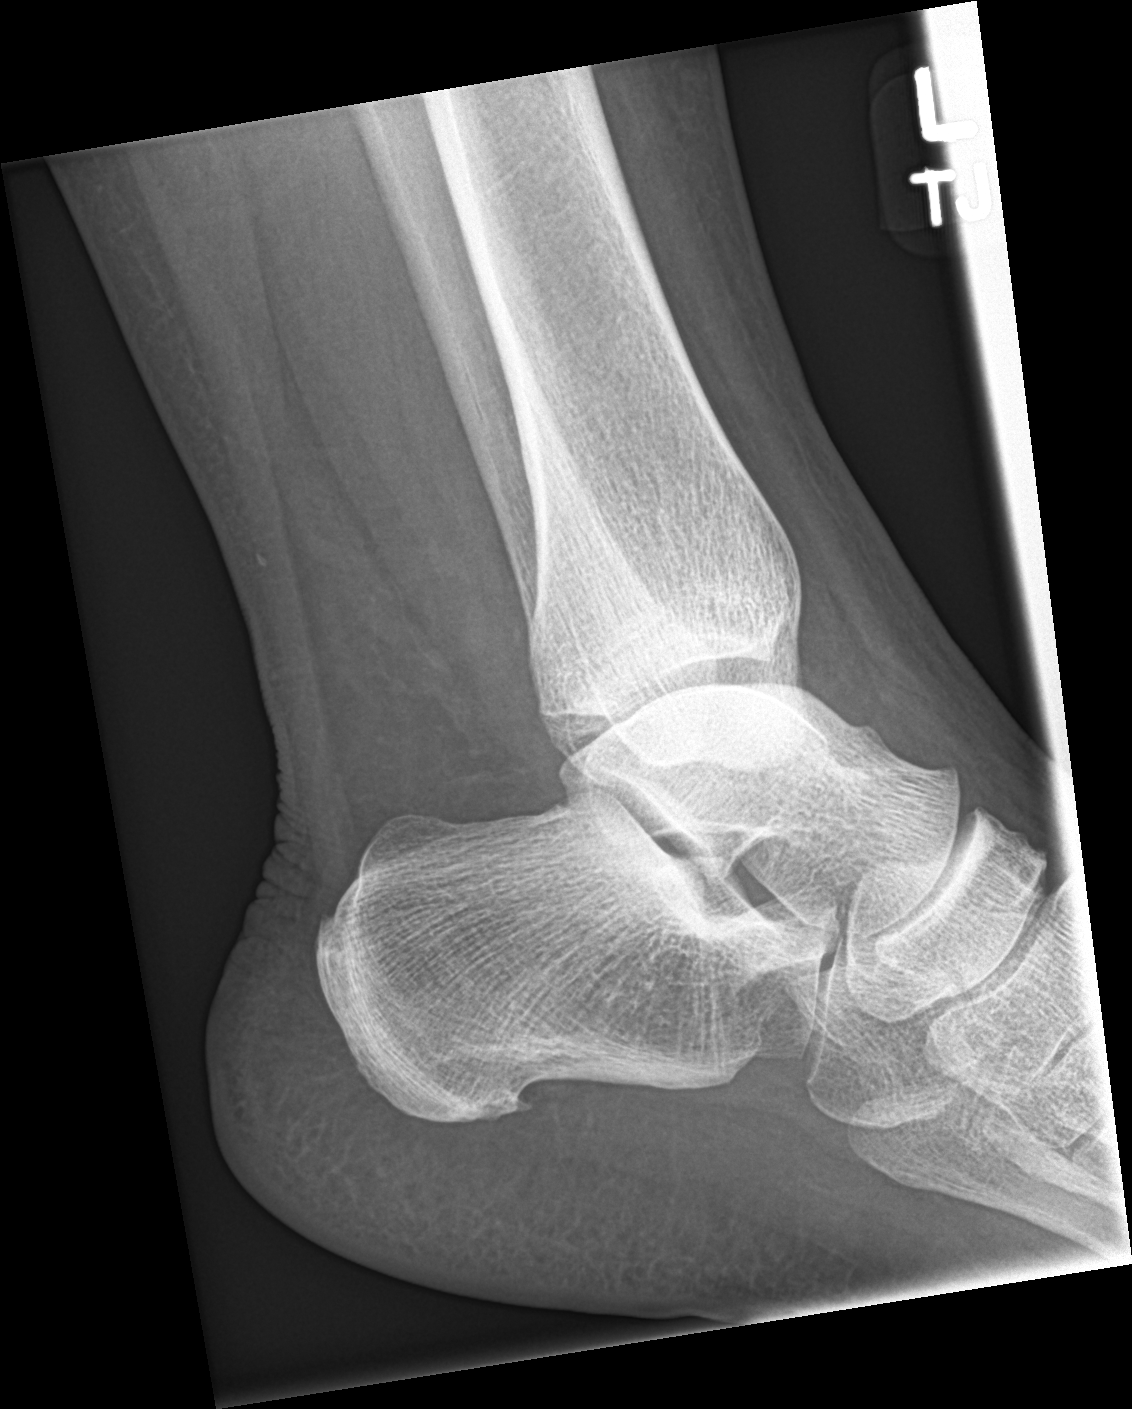

[3 of 3 positions shown; findings below may reference images not displayed]

FINDINGS: There is soft tissue swelling about the ankle and foot. There is no
definite acute displaced fracture. No dislocation. There are no
significant degenerative changes. There is a small plantar calcaneal
spur.
IMPRESSION: Soft tissue swelling without definite acute displaced fracture or
dislocation.
